# Patient Record
Sex: Male | Born: 2000 | Race: White | Hispanic: No | Marital: Single | Smoking: Never smoker
Health system: Southern US, Community
[De-identification: ages and names within clinical notes are randomized; demographics above are authoritative.]

---
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
## 2022-10-01 ENCOUNTER — Other Ambulatory Visit: Payer: Self-pay | Admitting: Orthopedic Surgery

## 2022-10-01 DIAGNOSIS — M5459 Other low back pain: Secondary | ICD-10-CM

## 2023-11-23 NOTE — Progress Notes (Signed)
 Notify the patient that current results are stable.  No change in current plan.  Patient should follow-up as previously discussed.

## 2023-12-22 NOTE — Progress Notes (Signed)
 Subjective   Patient ID:  Christopher Matthews is a 23 y.o. (DOB 01/01/2001) male    Patient presents with  . Fatigue     HPI: Patient is here today to discuss fatigue.  He states he is got some fatigue and lack of motivation.  He is interested in getting his testosterone levels checked today.  He states he has been gaining weight despite working out.  He states his symptoms started about a month ago.   Wt Readings from Last 3 Encounters:  12/22/23 220 lb (99.8 kg)  11/20/23 215 lb (97.5 kg)  11/18/22 214 lb (97.1 kg)        Reviewed and updated this visit by provider: Tobacco  Allergies  Meds  Problems  Med Hx  Surg Hx  Fam Hx        Review of Systems  Constitutional:  Positive for fatigue.  Respiratory: Negative.    Cardiovascular: Negative.   Musculoskeletal: Negative.   Psychiatric/Behavioral: Negative.       Objective   Vitals:   12/22/23 0836  BP: 124/88  Patient Position: Sitting  Pulse: 100  Temp: 97.2 F (36.2 C)  TempSrc: Temporal  Resp: 16  Height: 6' 5 (1.956 m)  Weight: 220 lb (99.8 kg)  SpO2: 97%  BMI (Calculated): 26.1  PainSc: 0-No pain     Physical Exam Constitutional:      Appearance: Normal appearance.  Cardiovascular:     Rate and Rhythm: Normal rate and regular rhythm.     Pulses: Normal pulses.     Heart sounds: Normal heart sounds.  Musculoskeletal:        General: Normal range of motion.  Pulmonary:     Effort: Pulmonary effort is normal.     Breath sounds: Normal breath sounds.  Neurological:     Mental Status: He is alert.  Psychiatric:        Mood and Affect: Mood normal.        Behavior: Behavior normal.        Thought Content: Thought content normal.        Judgment: Judgment normal.     Comments: PHQ-9 = 10, indicating moderate depression        Assessment and Plan  1. Fatigue, unspecified type (Primary) Assessment & Plan: -check labs as listed below -if labs are negative, will consider depression as  source of his fatigue, possibly seasonal  Orders: -     Comprehensive Metabolic Panel; Future -     POCT CBC W/DIFF -     Vitamin B12; Future -     Vitamin D 25 Hydroxy; Future -     Testosterone, Free, Total; Future -     TSH; Future 2. MDD (major depressive disorder), single episode, moderate (*) Assessment & Plan: -PHQ-9 = 10 -order for neuroflow placed  -if labs are negative, will consider medicaiton Orders: -     NEUROFLOW (AGE 33+)     Follow up if symptoms worsen or fail to improve.      Patient's Medications  New Prescriptions   No medications on file  Previous Medications   EPINEPHRINE 0.3 MG/0.3 ML INJECTION    USE AS DIRECTED FOR ALLERGIC REACTION CALL 911 AFTER USE  Modified Medications   No medications on file  Discontinued Medications   No medications on file      Risks, benefits, and alternatives of the medications and treatment plan prescribed today were discussed, and patient expressed understanding. Plan follow-up as discussed  or as needed if any worsening symptoms or change in condition.   A yearly preventative health exam was recommended and current age based recommendations were discussed.  I have reviewed the information contained in this note and personally verified its accuracy.  I obtained or reviewed the history of present illness and personally performed the physical exam. Fairy CHRISTELLA Pereyra, NP

## 2023-12-24 NOTE — Progress Notes (Signed)
 Please contact the patient concerning their result with the following instructions:  Your vitamin D level was low.    The following labs were normal, a variant of normal, or stable when compared to previous labs: On your blood counts, your white blood cell count, red blood cell count, and platelet counts were stable when compared to previous labs. On your metabolic panel, your kidney function, liver function, electrolytes were in the normal range. Your thyroid labs are within normal limits or stable when compared to previous labs. Your vitamin B12 was within normal limits.  I have sent in medication for you. You should be able to pick up a weekly vitamin D supplement at your pharmacy soon.  After 12 weeks, you can take over-the-counter 5000 units of vitamin D daily.  Follow-up as previously discussed.

## 2024-09-04 ENCOUNTER — Encounter (HOSPITAL_COMMUNITY): Payer: Self-pay | Admitting: *Deleted

## 2024-09-04 ENCOUNTER — Other Ambulatory Visit: Payer: Self-pay

## 2024-09-04 ENCOUNTER — Emergency Department (HOSPITAL_COMMUNITY)
Admission: EM | Admit: 2024-09-04 | Discharge: 2024-09-05 | Discharge status: Against Medical Advice | Attending: Emergency Medicine | Admitting: Emergency Medicine

## 2024-09-04 ENCOUNTER — Emergency Department (HOSPITAL_COMMUNITY)

## 2024-09-04 DIAGNOSIS — R000 Tachycardia, unspecified: Secondary | ICD-10-CM | POA: Insufficient documentation

## 2024-09-04 DIAGNOSIS — Z5321 Procedure and treatment not carried out due to patient leaving prior to being seen by health care provider: Secondary | ICD-10-CM | POA: Insufficient documentation

## 2024-09-04 DIAGNOSIS — R410 Disorientation, unspecified: Secondary | ICD-10-CM | POA: Insufficient documentation

## 2024-09-04 DIAGNOSIS — R079 Chest pain, unspecified: Secondary | ICD-10-CM | POA: Diagnosis not present

## 2024-09-04 LAB — BASIC METABOLIC PANEL WITH GFR
Anion gap: 16 — ABNORMAL HIGH (ref 5–15)
BUN: 13 mg/dL (ref 6–20)
CO2: 24 mmol/L (ref 22–32)
Calcium: 9.3 mg/dL (ref 8.9–10.3)
Chloride: 101 mmol/L (ref 98–111)
Creatinine, Ser: 1.05 mg/dL (ref 0.61–1.24)
GFR, Estimated: >60 mL/min (ref >60)
Glucose, Bld: 93 mg/dL (ref 70–99)
Potassium: 4.3 mmol/L (ref 3.5–5.1)
Sodium: 141 mmol/L (ref 135–145)

## 2024-09-04 LAB — TROPONIN I (HIGH SENSITIVITY)
Troponin I (High Sensitivity): 3 ng/L (ref <18)
Troponin I (High Sensitivity): <2 ng/L (ref <18)

## 2024-09-04 LAB — CBC
HCT: 43.4 % (ref 39.0–52.0)
Hemoglobin: 16.2 g/dL (ref 13.0–17.0)
MCH: 34 pg (ref 26.0–34.0)
MCHC: 37.3 g/dL — ABNORMAL HIGH (ref 30.0–36.0)
MCV: 91.2 fL (ref 80.0–100.0)
Platelets: 319 K/uL (ref 150–400)
RBC: 4.76 MIL/uL (ref 4.22–5.81)
RDW: 11.2 % — ABNORMAL LOW (ref 11.5–15.5)
WBC: 9 K/uL (ref 4.0–10.5)
nRBC: 0 % (ref 0.0–0.2)

## 2024-09-04 NOTE — ED Triage Notes (Addendum)
 The pt reports that he has ha tachycardia chest pain and not feeling well all day.  Hx of the same episodes

## 2024-09-05 NOTE — ED Notes (Signed)
 Patient transported to Ultrasound

## 2024-09-05 NOTE — ED Notes (Signed)
 Pt stated that he is leaving AMA due to long ED wait times.
